# Patient Record
Sex: Female | Born: 1950 | Race: Black or African American | Hispanic: No | Marital: Married | Smoking: Never smoker
Health system: Southern US, Community
[De-identification: ages and names within clinical notes are randomized; demographics above are authoritative.]

## PROBLEM LIST (undated history)

## (undated) HISTORY — PX: BREAST BIOPSY: SHX20

---

## 1997-10-24 ENCOUNTER — Ambulatory Visit (HOSPITAL_COMMUNITY): Admission: RE | Admit: 1997-10-24 | Discharge: 1997-10-24 | Payer: Self-pay | Admitting: Family Medicine

## 1997-10-31 ENCOUNTER — Ambulatory Visit (HOSPITAL_COMMUNITY): Admission: RE | Admit: 1997-10-31 | Discharge: 1997-10-31 | Payer: Self-pay | Admitting: Family Medicine

## 1997-11-19 ENCOUNTER — Ambulatory Visit (HOSPITAL_COMMUNITY): Admission: RE | Admit: 1997-11-19 | Discharge: 1997-11-19 | Payer: Self-pay | Admitting: Surgery

## 1998-11-21 ENCOUNTER — Ambulatory Visit (HOSPITAL_COMMUNITY): Admission: RE | Admit: 1998-11-21 | Discharge: 1998-11-21 | Payer: Self-pay

## 2001-02-09 ENCOUNTER — Other Ambulatory Visit: Admission: RE | Admit: 2001-02-09 | Discharge: 2001-02-09 | Payer: Self-pay | Admitting: *Deleted

## 2001-02-13 ENCOUNTER — Encounter: Payer: Self-pay | Admitting: Family Medicine

## 2001-02-13 ENCOUNTER — Ambulatory Visit (HOSPITAL_COMMUNITY): Admission: RE | Admit: 2001-02-13 | Discharge: 2001-02-13 | Payer: Self-pay | Admitting: Family Medicine

## 2002-06-12 ENCOUNTER — Encounter: Payer: Self-pay | Admitting: Family Medicine

## 2002-06-12 ENCOUNTER — Ambulatory Visit (HOSPITAL_COMMUNITY): Admission: RE | Admit: 2002-06-12 | Discharge: 2002-06-12 | Payer: Self-pay | Admitting: Family Medicine

## 2002-08-27 ENCOUNTER — Ambulatory Visit (HOSPITAL_COMMUNITY): Admission: RE | Admit: 2002-08-27 | Discharge: 2002-08-27 | Payer: Self-pay | Admitting: Gastroenterology

## 2003-06-11 ENCOUNTER — Other Ambulatory Visit: Admission: RE | Admit: 2003-06-11 | Discharge: 2003-06-11 | Payer: Self-pay | Admitting: Family Medicine

## 2003-06-19 ENCOUNTER — Ambulatory Visit (HOSPITAL_COMMUNITY): Admission: RE | Admit: 2003-06-19 | Discharge: 2003-06-19 | Payer: Self-pay | Admitting: Family Medicine

## 2004-06-15 ENCOUNTER — Other Ambulatory Visit: Admission: RE | Admit: 2004-06-15 | Discharge: 2004-06-15 | Payer: Self-pay | Admitting: Family Medicine

## 2004-06-19 ENCOUNTER — Ambulatory Visit (HOSPITAL_COMMUNITY): Admission: RE | Admit: 2004-06-19 | Discharge: 2004-06-19 | Payer: Self-pay | Admitting: Family Medicine

## 2005-06-22 ENCOUNTER — Ambulatory Visit (HOSPITAL_COMMUNITY): Admission: RE | Admit: 2005-06-22 | Discharge: 2005-06-22 | Payer: Self-pay | Admitting: Obstetrics and Gynecology

## 2005-06-30 ENCOUNTER — Encounter: Payer: Self-pay | Admitting: Surgery

## 2005-09-09 ENCOUNTER — Other Ambulatory Visit: Admission: RE | Admit: 2005-09-09 | Discharge: 2005-09-09 | Payer: Self-pay | Admitting: Family Medicine

## 2006-07-12 ENCOUNTER — Ambulatory Visit (HOSPITAL_COMMUNITY): Admission: RE | Admit: 2006-07-12 | Discharge: 2006-07-12 | Payer: Self-pay | Admitting: Family Medicine

## 2006-09-12 ENCOUNTER — Other Ambulatory Visit: Admission: RE | Admit: 2006-09-12 | Discharge: 2006-09-12 | Payer: Self-pay | Admitting: Family Medicine

## 2007-07-20 ENCOUNTER — Ambulatory Visit (HOSPITAL_COMMUNITY): Admission: RE | Admit: 2007-07-20 | Discharge: 2007-07-20 | Payer: Self-pay | Admitting: Family Medicine

## 2007-09-20 ENCOUNTER — Other Ambulatory Visit: Admission: RE | Admit: 2007-09-20 | Discharge: 2007-09-20 | Payer: Self-pay | Admitting: Family Medicine

## 2008-08-07 ENCOUNTER — Ambulatory Visit (HOSPITAL_COMMUNITY): Admission: RE | Admit: 2008-08-07 | Discharge: 2008-08-07 | Payer: Self-pay | Admitting: Family Medicine

## 2008-09-30 ENCOUNTER — Encounter (INDEPENDENT_AMBULATORY_CARE_PROVIDER_SITE_OTHER): Payer: Self-pay | Admitting: Family Medicine

## 2008-09-30 ENCOUNTER — Other Ambulatory Visit: Admission: RE | Admit: 2008-09-30 | Discharge: 2008-09-30 | Payer: Self-pay | Admitting: Family Medicine

## 2009-08-13 ENCOUNTER — Ambulatory Visit (HOSPITAL_COMMUNITY): Admission: RE | Admit: 2009-08-13 | Discharge: 2009-08-13 | Payer: Self-pay | Admitting: Family Medicine

## 2009-10-08 ENCOUNTER — Other Ambulatory Visit: Admission: RE | Admit: 2009-10-08 | Discharge: 2009-10-08 | Payer: Self-pay | Admitting: Family Medicine

## 2010-03-22 ENCOUNTER — Encounter: Payer: Self-pay | Admitting: Family Medicine

## 2010-07-13 ENCOUNTER — Other Ambulatory Visit (HOSPITAL_COMMUNITY): Payer: Self-pay | Admitting: Family Medicine

## 2010-07-13 DIAGNOSIS — Z1231 Encounter for screening mammogram for malignant neoplasm of breast: Secondary | ICD-10-CM

## 2010-07-17 NOTE — Op Note (Signed)
   NAME:  Alicia Rush, Alicia Rush NO.:  [id number]   MEDICAL RECORD NO.:  [id number]                   PATIENT TYPE:   LOCATION:                                       FACILITY:   PHYSICIAN:  Graylin Shiver, M.D.                DATE OF BIRTH:   DATE OF PROCEDURE:  08/27/2002  DATE OF DISCHARGE:                                 OPERATIVE REPORT   INDICATIONS FOR PROCEDURE:  Rectal bleeding and history of colon cancer in  her mother.   DESCRIPTION OF PROCEDURE:  Informed consent was obtained. Premedication was  Fentanyl 50 micrograms IV and Versed 6 mg IV.   With the patient in the left lateral decubitus position a rectal examination  was performed and no masses were felt. The Olympus colonoscope was inserted  into the rectum and advanced  around the colon to the cecum. Cecal landmarks  were identified.   The cecum and ascending colon were normal. The transverse colon was normal.  The descending colon, sigmoid and rectum were normal. The scope was  retroflexed in the  rectum. No abnormalities were seen. She tolerated the  procedure well without complications.   IMPRESSION:  Normal colonoscopy to the cecum.   PLAN:  In view of the family history I would recommend a followup  colonoscopy again in 5 years. I suspect that the intermittent rectal  bleeding that she experiences is secondary to some anal irritation.                                               Graylin Shiver, M.D.    Alicia Rush  D:  08/27/2002  T:  08/27/2002  Job:  [id number]

## 2010-08-17 ENCOUNTER — Ambulatory Visit (HOSPITAL_COMMUNITY)
Admission: RE | Admit: 2010-08-17 | Discharge: 2010-08-17 | Disposition: A | Discharge status: Home / Self Care | Payer: 59 | Source: Ambulatory Visit | Attending: Family Medicine | Admitting: Family Medicine

## 2010-08-17 DIAGNOSIS — Z1231 Encounter for screening mammogram for malignant neoplasm of breast: Secondary | ICD-10-CM | POA: Insufficient documentation

## 2011-07-27 ENCOUNTER — Other Ambulatory Visit (HOSPITAL_COMMUNITY): Payer: Self-pay | Admitting: Family Medicine

## 2011-07-27 DIAGNOSIS — Z1231 Encounter for screening mammogram for malignant neoplasm of breast: Secondary | ICD-10-CM

## 2011-08-23 ENCOUNTER — Ambulatory Visit (HOSPITAL_COMMUNITY)
Admission: RE | Admit: 2011-08-23 | Discharge: 2011-08-23 | Disposition: A | Discharge status: Home / Self Care | Payer: 59 | Source: Ambulatory Visit | Attending: Family Medicine | Admitting: Family Medicine

## 2011-08-23 DIAGNOSIS — Z1231 Encounter for screening mammogram for malignant neoplasm of breast: Secondary | ICD-10-CM | POA: Insufficient documentation

## 2012-08-07 ENCOUNTER — Other Ambulatory Visit (HOSPITAL_COMMUNITY): Payer: Self-pay | Admitting: Family Medicine

## 2012-08-07 DIAGNOSIS — Z1231 Encounter for screening mammogram for malignant neoplasm of breast: Secondary | ICD-10-CM

## 2012-08-25 ENCOUNTER — Ambulatory Visit (HOSPITAL_COMMUNITY)
Admission: RE | Admit: 2012-08-25 | Discharge: 2012-08-25 | Disposition: A | Discharge status: Home / Self Care | Payer: 59 | Source: Ambulatory Visit | Attending: Family Medicine | Admitting: Family Medicine

## 2012-08-25 DIAGNOSIS — Z1231 Encounter for screening mammogram for malignant neoplasm of breast: Secondary | ICD-10-CM | POA: Insufficient documentation

## 2012-08-29 ENCOUNTER — Other Ambulatory Visit: Payer: Self-pay | Admitting: Family Medicine

## 2012-08-29 DIAGNOSIS — R928 Other abnormal and inconclusive findings on diagnostic imaging of breast: Secondary | ICD-10-CM

## 2012-09-08 ENCOUNTER — Ambulatory Visit
Admission: RE | Admit: 2012-09-08 | Discharge: 2012-09-08 | Disposition: A | Discharge status: Home / Self Care | Payer: 59 | Source: Ambulatory Visit | Attending: Family Medicine | Admitting: Family Medicine

## 2012-09-08 ENCOUNTER — Other Ambulatory Visit: Payer: Self-pay | Admitting: Family Medicine

## 2012-09-08 DIAGNOSIS — R928 Other abnormal and inconclusive findings on diagnostic imaging of breast: Secondary | ICD-10-CM

## 2012-09-22 ENCOUNTER — Ambulatory Visit
Admission: RE | Admit: 2012-09-22 | Discharge: 2012-09-22 | Disposition: A | Discharge status: Home / Self Care | Payer: 59 | Source: Ambulatory Visit | Attending: Family Medicine | Admitting: Family Medicine

## 2012-09-22 DIAGNOSIS — R928 Other abnormal and inconclusive findings on diagnostic imaging of breast: Secondary | ICD-10-CM

## 2012-10-23 ENCOUNTER — Other Ambulatory Visit (HOSPITAL_COMMUNITY)
Admission: RE | Admit: 2012-10-23 | Discharge: 2012-10-23 | Disposition: A | Discharge status: Home / Self Care | Payer: 59 | Source: Ambulatory Visit | Attending: Family Medicine | Admitting: Family Medicine

## 2012-10-23 ENCOUNTER — Other Ambulatory Visit: Payer: Self-pay | Admitting: Family Medicine

## 2012-10-23 DIAGNOSIS — Z0000 Encounter for general adult medical examination without abnormal findings: Secondary | ICD-10-CM | POA: Insufficient documentation

## 2013-09-17 ENCOUNTER — Other Ambulatory Visit: Payer: Self-pay

## 2013-09-17 DIAGNOSIS — Z1231 Encounter for screening mammogram for malignant neoplasm of breast: Secondary | ICD-10-CM

## 2013-09-28 ENCOUNTER — Ambulatory Visit
Admission: RE | Admit: 2013-09-28 | Discharge: 2013-09-28 | Disposition: A | Discharge status: Home / Self Care | Payer: 59 | Source: Ambulatory Visit

## 2013-09-28 DIAGNOSIS — Z1231 Encounter for screening mammogram for malignant neoplasm of breast: Secondary | ICD-10-CM

## 2014-08-28 ENCOUNTER — Other Ambulatory Visit: Payer: Self-pay

## 2014-08-28 DIAGNOSIS — Z1231 Encounter for screening mammogram for malignant neoplasm of breast: Secondary | ICD-10-CM

## 2014-10-09 ENCOUNTER — Ambulatory Visit
Admission: RE | Admit: 2014-10-09 | Discharge: 2014-10-09 | Disposition: A | Discharge status: Home / Self Care | Payer: 59 | Source: Ambulatory Visit

## 2014-10-09 DIAGNOSIS — Z1231 Encounter for screening mammogram for malignant neoplasm of breast: Secondary | ICD-10-CM

## 2015-09-30 ENCOUNTER — Other Ambulatory Visit: Payer: Self-pay | Admitting: Family Medicine

## 2015-09-30 DIAGNOSIS — Z1231 Encounter for screening mammogram for malignant neoplasm of breast: Secondary | ICD-10-CM

## 2015-10-13 ENCOUNTER — Ambulatory Visit: Payer: 59

## 2015-10-14 ENCOUNTER — Ambulatory Visit
Admission: RE | Admit: 2015-10-14 | Discharge: 2015-10-14 | Disposition: A | Discharge status: Home / Self Care | Payer: 59 | Source: Ambulatory Visit | Attending: Family Medicine | Admitting: Family Medicine

## 2015-10-14 DIAGNOSIS — Z1231 Encounter for screening mammogram for malignant neoplasm of breast: Secondary | ICD-10-CM

## 2015-11-04 ENCOUNTER — Other Ambulatory Visit (HOSPITAL_COMMUNITY)
Admission: RE | Admit: 2015-11-04 | Discharge: 2015-11-04 | Disposition: A | Discharge status: Home / Self Care | Payer: 59 | Source: Ambulatory Visit | Attending: Family Medicine | Admitting: Family Medicine

## 2015-11-04 ENCOUNTER — Other Ambulatory Visit: Payer: Self-pay | Admitting: Family Medicine

## 2015-11-04 DIAGNOSIS — Z01419 Encounter for gynecological examination (general) (routine) without abnormal findings: Secondary | ICD-10-CM | POA: Diagnosis not present

## 2015-11-05 LAB — CYTOLOGY - PAP

## 2016-09-10 ENCOUNTER — Other Ambulatory Visit: Payer: Self-pay | Admitting: Family Medicine

## 2016-09-10 DIAGNOSIS — Z1231 Encounter for screening mammogram for malignant neoplasm of breast: Secondary | ICD-10-CM

## 2016-10-19 ENCOUNTER — Ambulatory Visit
Admission: RE | Admit: 2016-10-19 | Discharge: 2016-10-19 | Disposition: A | Discharge status: Home / Self Care | Payer: 59 | Source: Ambulatory Visit | Attending: Family Medicine | Admitting: Family Medicine

## 2016-10-19 DIAGNOSIS — Z1231 Encounter for screening mammogram for malignant neoplasm of breast: Secondary | ICD-10-CM

## 2017-09-06 DIAGNOSIS — J0100 Acute maxillary sinusitis, unspecified: Secondary | ICD-10-CM | POA: Diagnosis not present

## 2017-09-29 ENCOUNTER — Other Ambulatory Visit: Payer: Self-pay | Admitting: Family Medicine

## 2017-09-29 DIAGNOSIS — Z1231 Encounter for screening mammogram for malignant neoplasm of breast: Secondary | ICD-10-CM

## 2017-11-04 ENCOUNTER — Ambulatory Visit: Payer: 59

## 2017-11-04 DIAGNOSIS — R030 Elevated blood-pressure reading, without diagnosis of hypertension: Secondary | ICD-10-CM | POA: Diagnosis not present

## 2017-11-04 DIAGNOSIS — E039 Hypothyroidism, unspecified: Secondary | ICD-10-CM | POA: Diagnosis not present

## 2017-11-04 DIAGNOSIS — L680 Hirsutism: Secondary | ICD-10-CM | POA: Diagnosis not present

## 2017-11-04 DIAGNOSIS — Z1159 Encounter for screening for other viral diseases: Secondary | ICD-10-CM | POA: Diagnosis not present

## 2017-11-04 DIAGNOSIS — Z23 Encounter for immunization: Secondary | ICD-10-CM | POA: Diagnosis not present

## 2017-11-04 DIAGNOSIS — E2839 Other primary ovarian failure: Secondary | ICD-10-CM | POA: Diagnosis not present

## 2017-11-04 DIAGNOSIS — Z0000 Encounter for general adult medical examination without abnormal findings: Secondary | ICD-10-CM | POA: Diagnosis not present

## 2017-11-04 DIAGNOSIS — E782 Mixed hyperlipidemia: Secondary | ICD-10-CM | POA: Diagnosis not present

## 2017-11-08 ENCOUNTER — Ambulatory Visit
Admission: RE | Admit: 2017-11-08 | Discharge: 2017-11-08 | Disposition: A | Discharge status: Home / Self Care | Payer: Medicare Other | Source: Ambulatory Visit | Attending: Family Medicine | Admitting: Family Medicine

## 2017-11-08 DIAGNOSIS — Z1231 Encounter for screening mammogram for malignant neoplasm of breast: Secondary | ICD-10-CM

## 2017-11-21 DIAGNOSIS — Z1211 Encounter for screening for malignant neoplasm of colon: Secondary | ICD-10-CM | POA: Diagnosis not present

## 2017-11-21 DIAGNOSIS — K5140 Inflammatory polyps of colon without complications: Secondary | ICD-10-CM | POA: Diagnosis not present

## 2017-11-21 DIAGNOSIS — K635 Polyp of colon: Secondary | ICD-10-CM | POA: Diagnosis not present

## 2017-11-21 DIAGNOSIS — Z800 Family history of malignant neoplasm of digestive organs: Secondary | ICD-10-CM | POA: Diagnosis not present

## 2017-11-23 DIAGNOSIS — K5140 Inflammatory polyps of colon without complications: Secondary | ICD-10-CM | POA: Diagnosis not present

## 2017-11-23 DIAGNOSIS — K635 Polyp of colon: Secondary | ICD-10-CM | POA: Diagnosis not present

## 2017-12-20 DIAGNOSIS — E2839 Other primary ovarian failure: Secondary | ICD-10-CM | POA: Diagnosis not present

## 2018-07-17 DIAGNOSIS — M79605 Pain in left leg: Secondary | ICD-10-CM | POA: Diagnosis not present

## 2018-07-17 DIAGNOSIS — M25562 Pain in left knee: Secondary | ICD-10-CM | POA: Diagnosis not present

## 2018-07-17 DIAGNOSIS — M7122 Synovial cyst of popliteal space [Baker], left knee: Secondary | ICD-10-CM | POA: Diagnosis not present

## 2018-07-17 DIAGNOSIS — E7800 Pure hypercholesterolemia, unspecified: Secondary | ICD-10-CM | POA: Diagnosis not present

## 2018-07-17 DIAGNOSIS — L680 Hirsutism: Secondary | ICD-10-CM | POA: Diagnosis not present

## 2018-09-06 DIAGNOSIS — M25562 Pain in left knee: Secondary | ICD-10-CM | POA: Diagnosis not present

## 2018-09-18 DIAGNOSIS — E782 Mixed hyperlipidemia: Secondary | ICD-10-CM | POA: Diagnosis not present

## 2018-10-03 ENCOUNTER — Other Ambulatory Visit: Payer: Self-pay | Admitting: Family Medicine

## 2018-10-03 DIAGNOSIS — Z1231 Encounter for screening mammogram for malignant neoplasm of breast: Secondary | ICD-10-CM

## 2018-11-10 DIAGNOSIS — E7800 Pure hypercholesterolemia, unspecified: Secondary | ICD-10-CM | POA: Diagnosis not present

## 2018-11-10 DIAGNOSIS — Z0000 Encounter for general adult medical examination without abnormal findings: Secondary | ICD-10-CM | POA: Diagnosis not present

## 2018-11-10 DIAGNOSIS — E039 Hypothyroidism, unspecified: Secondary | ICD-10-CM | POA: Diagnosis not present

## 2018-11-10 DIAGNOSIS — R030 Elevated blood-pressure reading, without diagnosis of hypertension: Secondary | ICD-10-CM | POA: Diagnosis not present

## 2018-11-10 DIAGNOSIS — R600 Localized edema: Secondary | ICD-10-CM | POA: Diagnosis not present

## 2018-11-15 DIAGNOSIS — Z23 Encounter for immunization: Secondary | ICD-10-CM | POA: Diagnosis not present

## 2018-11-15 DIAGNOSIS — R600 Localized edema: Secondary | ICD-10-CM | POA: Diagnosis not present

## 2018-11-15 DIAGNOSIS — E039 Hypothyroidism, unspecified: Secondary | ICD-10-CM | POA: Diagnosis not present

## 2018-11-16 ENCOUNTER — Ambulatory Visit: Payer: Medicare Other

## 2018-11-17 ENCOUNTER — Ambulatory Visit
Admission: RE | Admit: 2018-11-17 | Discharge: 2018-11-17 | Disposition: A | Discharge status: Home / Self Care | Payer: Medicare Other | Source: Ambulatory Visit | Attending: Family Medicine | Admitting: Family Medicine

## 2018-11-17 ENCOUNTER — Other Ambulatory Visit: Payer: Self-pay

## 2018-11-17 DIAGNOSIS — Z1231 Encounter for screening mammogram for malignant neoplasm of breast: Secondary | ICD-10-CM | POA: Diagnosis not present

## 2019-03-08 DIAGNOSIS — M25562 Pain in left knee: Secondary | ICD-10-CM | POA: Diagnosis not present

## 2019-03-23 DIAGNOSIS — N760 Acute vaginitis: Secondary | ICD-10-CM | POA: Diagnosis not present

## 2019-05-22 DIAGNOSIS — R030 Elevated blood-pressure reading, without diagnosis of hypertension: Secondary | ICD-10-CM | POA: Diagnosis not present

## 2019-05-22 DIAGNOSIS — E039 Hypothyroidism, unspecified: Secondary | ICD-10-CM | POA: Diagnosis not present

## 2019-05-22 DIAGNOSIS — R600 Localized edema: Secondary | ICD-10-CM | POA: Diagnosis not present

## 2019-08-14 DIAGNOSIS — M65332 Trigger finger, left middle finger: Secondary | ICD-10-CM | POA: Diagnosis not present

## 2019-09-25 DIAGNOSIS — M65332 Trigger finger, left middle finger: Secondary | ICD-10-CM | POA: Diagnosis not present

## 2019-10-16 ENCOUNTER — Other Ambulatory Visit: Payer: Self-pay | Admitting: Family Medicine

## 2019-10-16 DIAGNOSIS — Z0000 Encounter for general adult medical examination without abnormal findings: Secondary | ICD-10-CM

## 2019-10-29 DIAGNOSIS — E7800 Pure hypercholesterolemia, unspecified: Secondary | ICD-10-CM | POA: Diagnosis not present

## 2019-10-29 DIAGNOSIS — E039 Hypothyroidism, unspecified: Secondary | ICD-10-CM | POA: Diagnosis not present

## 2019-10-29 DIAGNOSIS — I10 Essential (primary) hypertension: Secondary | ICD-10-CM | POA: Diagnosis not present

## 2019-11-12 DIAGNOSIS — I10 Essential (primary) hypertension: Secondary | ICD-10-CM | POA: Diagnosis not present

## 2019-11-12 DIAGNOSIS — E7800 Pure hypercholesterolemia, unspecified: Secondary | ICD-10-CM | POA: Diagnosis not present

## 2019-11-12 DIAGNOSIS — Z0000 Encounter for general adult medical examination without abnormal findings: Secondary | ICD-10-CM | POA: Diagnosis not present

## 2019-11-12 DIAGNOSIS — Z23 Encounter for immunization: Secondary | ICD-10-CM | POA: Diagnosis not present

## 2019-11-12 DIAGNOSIS — E039 Hypothyroidism, unspecified: Secondary | ICD-10-CM | POA: Diagnosis not present

## 2019-11-20 ENCOUNTER — Other Ambulatory Visit: Payer: Self-pay

## 2019-11-20 ENCOUNTER — Ambulatory Visit
Admission: RE | Admit: 2019-11-20 | Discharge: 2019-11-20 | Disposition: A | Discharge status: Home / Self Care | Payer: Medicare Other | Source: Ambulatory Visit | Attending: Family Medicine | Admitting: Family Medicine

## 2019-11-20 DIAGNOSIS — Z0000 Encounter for general adult medical examination without abnormal findings: Secondary | ICD-10-CM

## 2019-11-20 DIAGNOSIS — Z1231 Encounter for screening mammogram for malignant neoplasm of breast: Secondary | ICD-10-CM | POA: Diagnosis not present

## 2019-12-14 DIAGNOSIS — Z23 Encounter for immunization: Secondary | ICD-10-CM | POA: Diagnosis not present

## 2020-05-16 DIAGNOSIS — M7731 Calcaneal spur, right foot: Secondary | ICD-10-CM | POA: Diagnosis not present

## 2020-05-16 DIAGNOSIS — M79671 Pain in right foot: Secondary | ICD-10-CM | POA: Diagnosis not present

## 2020-06-02 DIAGNOSIS — M79671 Pain in right foot: Secondary | ICD-10-CM | POA: Diagnosis not present

## 2020-06-03 DIAGNOSIS — E7800 Pure hypercholesterolemia, unspecified: Secondary | ICD-10-CM | POA: Diagnosis not present

## 2020-06-03 DIAGNOSIS — I10 Essential (primary) hypertension: Secondary | ICD-10-CM | POA: Diagnosis not present

## 2020-06-03 DIAGNOSIS — E039 Hypothyroidism, unspecified: Secondary | ICD-10-CM | POA: Diagnosis not present

## 2020-06-06 DIAGNOSIS — M79671 Pain in right foot: Secondary | ICD-10-CM | POA: Diagnosis not present

## 2020-06-10 DIAGNOSIS — M79671 Pain in right foot: Secondary | ICD-10-CM | POA: Diagnosis not present

## 2020-06-13 DIAGNOSIS — M79671 Pain in right foot: Secondary | ICD-10-CM | POA: Diagnosis not present

## 2020-06-17 DIAGNOSIS — M79671 Pain in right foot: Secondary | ICD-10-CM | POA: Diagnosis not present

## 2020-06-20 DIAGNOSIS — M79671 Pain in right foot: Secondary | ICD-10-CM | POA: Diagnosis not present

## 2020-06-23 DIAGNOSIS — M79671 Pain in right foot: Secondary | ICD-10-CM | POA: Diagnosis not present

## 2020-06-25 DIAGNOSIS — M79671 Pain in right foot: Secondary | ICD-10-CM | POA: Diagnosis not present

## 2020-07-01 DIAGNOSIS — M79671 Pain in right foot: Secondary | ICD-10-CM | POA: Diagnosis not present

## 2020-07-03 DIAGNOSIS — M79671 Pain in right foot: Secondary | ICD-10-CM | POA: Diagnosis not present

## 2020-07-15 DIAGNOSIS — M79671 Pain in right foot: Secondary | ICD-10-CM | POA: Diagnosis not present

## 2020-08-12 DIAGNOSIS — E7800 Pure hypercholesterolemia, unspecified: Secondary | ICD-10-CM | POA: Diagnosis not present

## 2020-08-12 DIAGNOSIS — I10 Essential (primary) hypertension: Secondary | ICD-10-CM | POA: Diagnosis not present

## 2020-08-12 DIAGNOSIS — E039 Hypothyroidism, unspecified: Secondary | ICD-10-CM | POA: Diagnosis not present

## 2020-08-12 DIAGNOSIS — H3581 Retinal edema: Secondary | ICD-10-CM | POA: Diagnosis not present

## 2020-08-30 DIAGNOSIS — R059 Cough, unspecified: Secondary | ICD-10-CM | POA: Diagnosis not present

## 2020-08-30 DIAGNOSIS — R0981 Nasal congestion: Secondary | ICD-10-CM | POA: Diagnosis not present

## 2020-08-30 DIAGNOSIS — R509 Fever, unspecified: Secondary | ICD-10-CM | POA: Diagnosis not present

## 2020-08-30 DIAGNOSIS — U071 COVID-19: Secondary | ICD-10-CM | POA: Diagnosis not present

## 2020-08-30 DIAGNOSIS — R5383 Other fatigue: Secondary | ICD-10-CM | POA: Diagnosis not present

## 2020-10-19 IMAGING — MG MM DIGITAL SCREENING BILAT W/ TOMO W/ CAD
6 of 12 series · 6 of 36 positions shown · non-contrast
Comparison: Previous exam(s).

CLINICAL DATA: Screening.

EXAM:
DIGITAL SCREENING BILATERAL MAMMOGRAM WITH TOMO AND CAD

[R MLO synth-2D (1 of 2)]
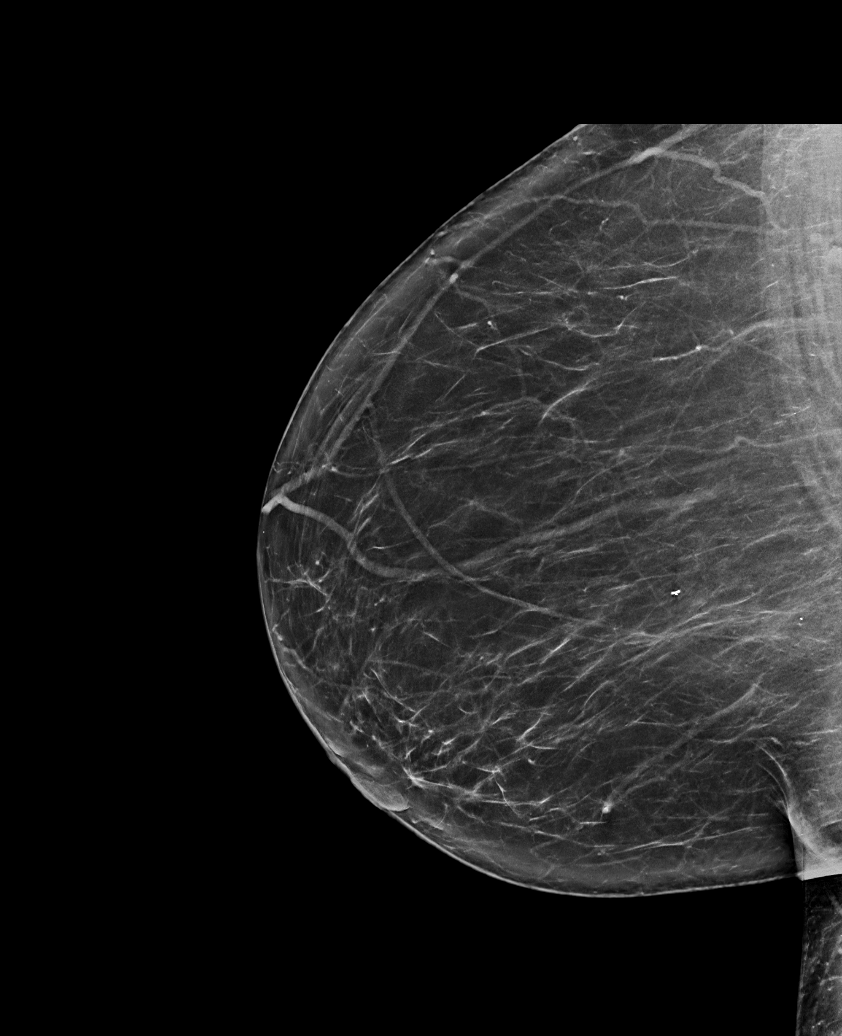

[R MLO synth-2D (2 of 2)]
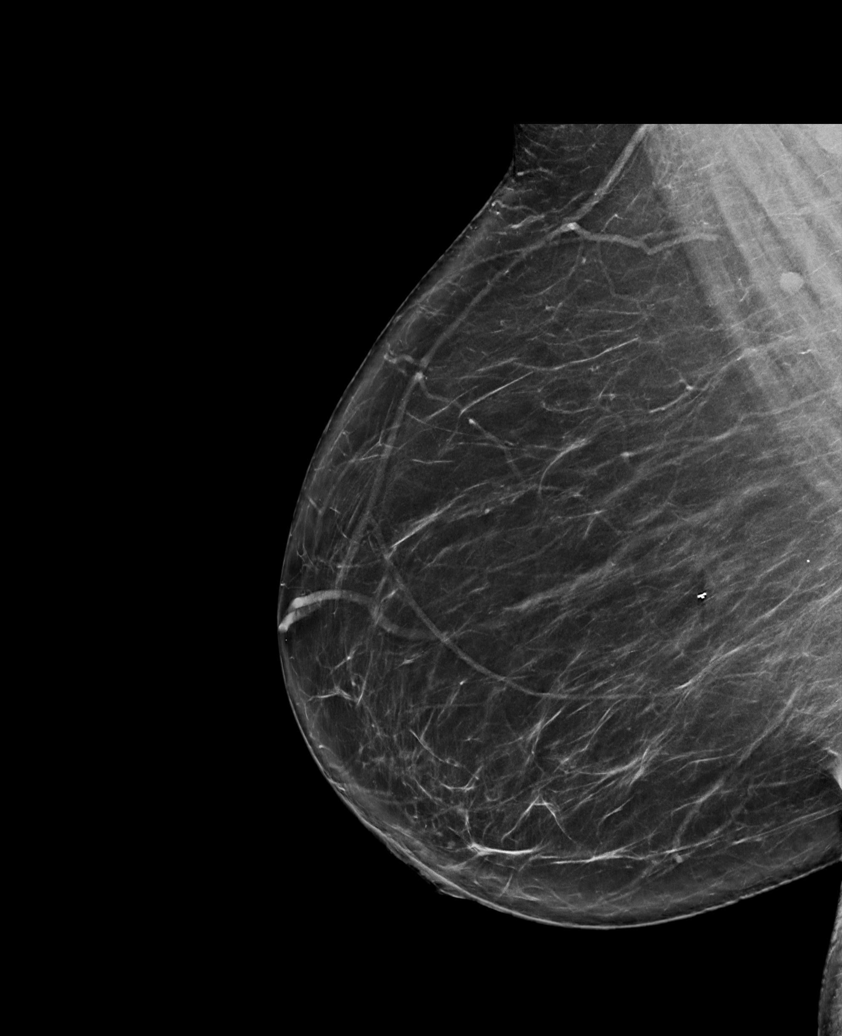

[L CC synth-2D (1 of 2)]
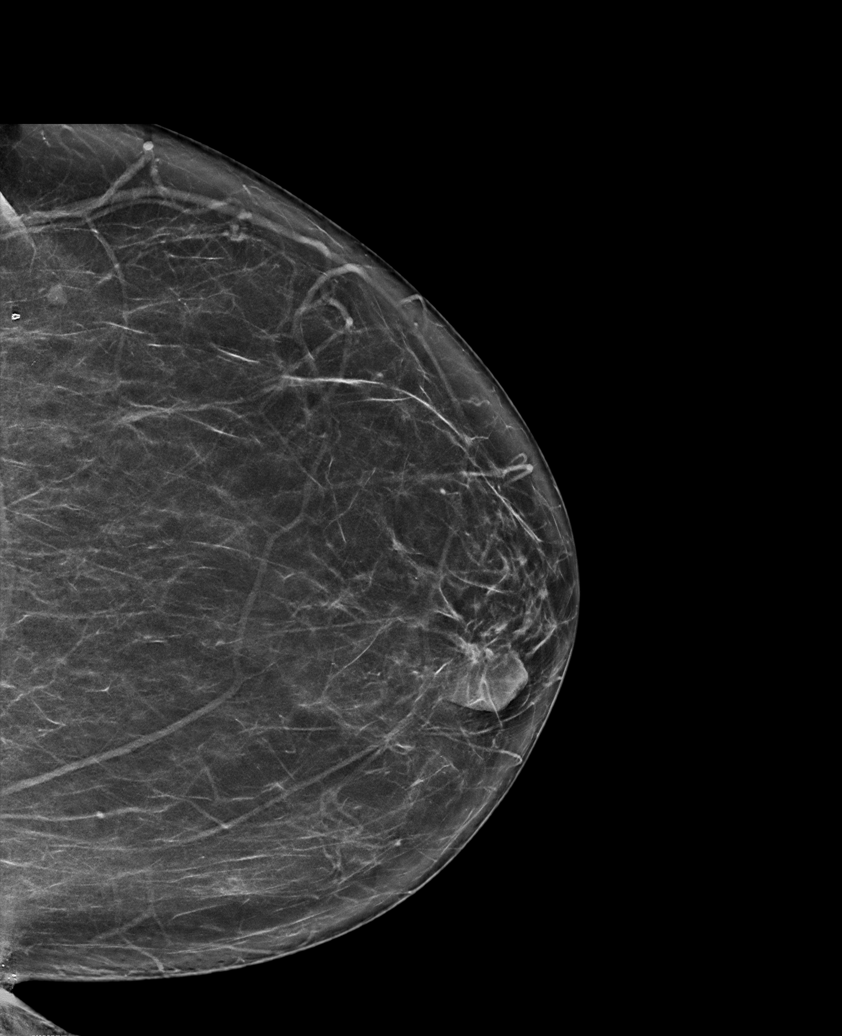

[L MLO synth-2D]
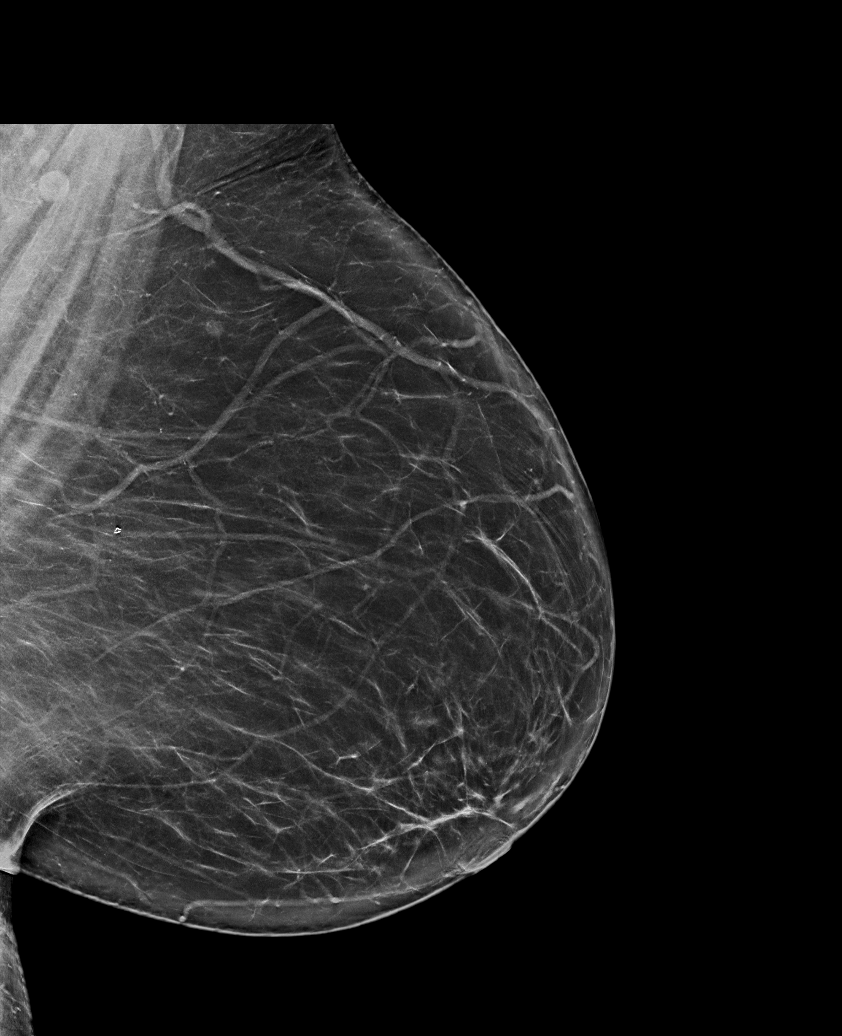

[R CC synth-2D]
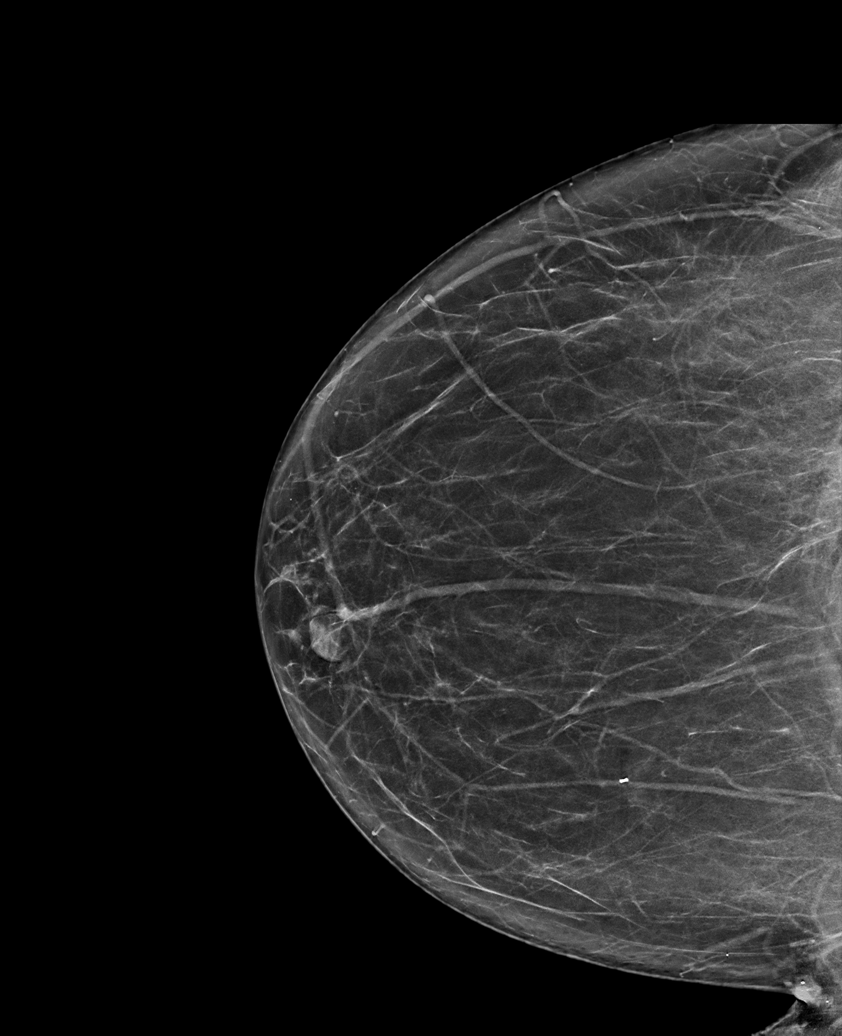

[L CC synth-2D (2 of 2)]
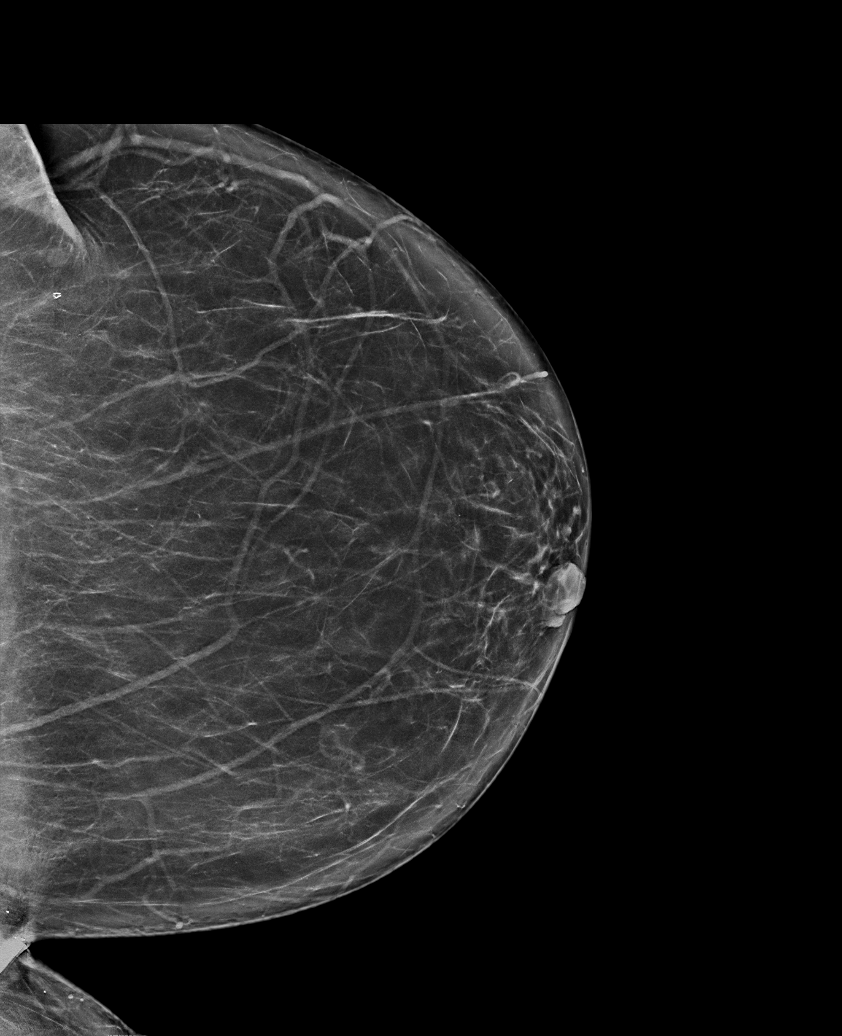

[6 of 36 positions shown; findings below may reference images not displayed]

ACR Breast Density Category b: There are scattered areas of
fibroglandular density.
FINDINGS: There are no findings suspicious for malignancy. Images were
processed with CAD.
IMPRESSION: No mammographic evidence of malignancy. A result letter of this
screening mammogram will be mailed directly to the patient.

RECOMMENDATION:
Screening mammogram in one year. (Code:CN-[id number])

BI-RADS CATEGORY  1: Negative.

## 2020-10-20 DIAGNOSIS — I10 Essential (primary) hypertension: Secondary | ICD-10-CM | POA: Diagnosis not present

## 2020-10-20 DIAGNOSIS — E7800 Pure hypercholesterolemia, unspecified: Secondary | ICD-10-CM | POA: Diagnosis not present

## 2020-10-20 DIAGNOSIS — E039 Hypothyroidism, unspecified: Secondary | ICD-10-CM | POA: Diagnosis not present

## 2020-11-07 ENCOUNTER — Other Ambulatory Visit: Payer: Self-pay | Admitting: Family Medicine

## 2020-11-07 DIAGNOSIS — Z1231 Encounter for screening mammogram for malignant neoplasm of breast: Secondary | ICD-10-CM

## 2020-11-25 ENCOUNTER — Other Ambulatory Visit: Payer: Self-pay

## 2020-11-25 ENCOUNTER — Ambulatory Visit
Admission: RE | Admit: 2020-11-25 | Discharge: 2020-11-25 | Disposition: A | Discharge status: Home / Self Care | Payer: Medicare Other | Source: Ambulatory Visit | Attending: Family Medicine | Admitting: Family Medicine

## 2020-11-25 DIAGNOSIS — Z1231 Encounter for screening mammogram for malignant neoplasm of breast: Secondary | ICD-10-CM | POA: Diagnosis not present

## 2021-01-05 DIAGNOSIS — M170 Bilateral primary osteoarthritis of knee: Secondary | ICD-10-CM | POA: Diagnosis not present

## 2021-01-05 DIAGNOSIS — M79671 Pain in right foot: Secondary | ICD-10-CM | POA: Diagnosis not present

## 2021-01-08 DIAGNOSIS — E7800 Pure hypercholesterolemia, unspecified: Secondary | ICD-10-CM | POA: Diagnosis not present

## 2021-01-08 DIAGNOSIS — E039 Hypothyroidism, unspecified: Secondary | ICD-10-CM | POA: Diagnosis not present

## 2021-01-08 DIAGNOSIS — I10 Essential (primary) hypertension: Secondary | ICD-10-CM | POA: Diagnosis not present

## 2021-01-09 ENCOUNTER — Emergency Department (HOSPITAL_COMMUNITY): Payer: Medicare Other

## 2021-01-09 ENCOUNTER — Other Ambulatory Visit: Payer: Self-pay

## 2021-01-09 ENCOUNTER — Emergency Department (HOSPITAL_COMMUNITY)
Admission: EM | Admit: 2021-01-09 | Discharge: 2021-01-10 | Disposition: A | Discharge status: Home / Self Care | Payer: Medicare Other | Attending: Emergency Medicine | Admitting: Emergency Medicine

## 2021-01-09 DIAGNOSIS — I639 Cerebral infarction, unspecified: Secondary | ICD-10-CM | POA: Diagnosis not present

## 2021-01-09 DIAGNOSIS — R9431 Abnormal electrocardiogram [ECG] [EKG]: Secondary | ICD-10-CM | POA: Diagnosis not present

## 2021-01-09 DIAGNOSIS — M6281 Muscle weakness (generalized): Secondary | ICD-10-CM | POA: Diagnosis not present

## 2021-01-09 DIAGNOSIS — R29898 Other symptoms and signs involving the musculoskeletal system: Secondary | ICD-10-CM | POA: Diagnosis not present

## 2021-01-09 DIAGNOSIS — R29818 Other symptoms and signs involving the nervous system: Secondary | ICD-10-CM | POA: Diagnosis not present

## 2021-01-09 DIAGNOSIS — G43109 Migraine with aura, not intractable, without status migrainosus: Secondary | ICD-10-CM | POA: Diagnosis not present

## 2021-01-09 DIAGNOSIS — R531 Weakness: Secondary | ICD-10-CM | POA: Diagnosis not present

## 2021-01-09 DIAGNOSIS — G43809 Other migraine, not intractable, without status migrainosus: Secondary | ICD-10-CM | POA: Diagnosis not present

## 2021-01-09 LAB — DIFFERENTIAL
Abs Immature Granulocytes: 0.02 10*3/uL (ref 0.00–0.07)
Basophils Absolute: 0.1 10*3/uL (ref 0.0–0.1)
Basophils Relative: 1 %
Eosinophils Absolute: 0.3 10*3/uL (ref 0.0–0.5)
Eosinophils Relative: 4 %
Immature Granulocytes: 0 %
Lymphocytes Relative: 33 %
Lymphs Abs: 2.5 10*3/uL (ref 0.7–4.0)
Monocytes Absolute: 0.6 10*3/uL (ref 0.1–1.0)
Monocytes Relative: 8 %
Neutro Abs: 4.2 10*3/uL (ref 1.7–7.7)
Neutrophils Relative %: 54 %

## 2021-01-09 LAB — COMPREHENSIVE METABOLIC PANEL
ALT: 14 U/L (ref 0–44)
AST: 20 U/L (ref 15–41)
Albumin: 3.7 g/dL (ref 3.5–5.0)
Alkaline Phosphatase: 71 U/L (ref 38–126)
Anion gap: 8 (ref 5–15)
BUN: 19 mg/dL (ref 8–23)
CO2: 27 mmol/L (ref 22–32)
Calcium: 9 mg/dL (ref 8.9–10.3)
Chloride: 104 mmol/L (ref 98–111)
Creatinine, Ser: 0.97 mg/dL (ref 0.44–1.00)
GFR, Estimated: >60 mL/min (ref >60)
Glucose, Bld: 105 mg/dL — ABNORMAL HIGH (ref 70–99)
Potassium: 3.2 mmol/L — ABNORMAL LOW (ref 3.5–5.1)
Sodium: 139 mmol/L (ref 135–145)
Total Bilirubin: 0.8 mg/dL (ref 0.3–1.2)
Total Protein: 7.2 g/dL (ref 6.5–8.1)

## 2021-01-09 LAB — I-STAT CHEM 8, ED
BUN: 21 mg/dL (ref 8–23)
Calcium, Ion: 1.17 mmol/L (ref 1.15–1.40)
Chloride: 102 mmol/L (ref 98–111)
Creatinine, Ser: 1 mg/dL (ref 0.44–1.00)
Glucose, Bld: 105 mg/dL — ABNORMAL HIGH (ref 70–99)
HCT: 44 % (ref 36.0–46.0)
Hemoglobin: 15 g/dL (ref 12.0–15.0)
Potassium: 3.2 mmol/L — ABNORMAL LOW (ref 3.5–5.1)
Sodium: 142 mmol/L (ref 135–145)
TCO2: 27 mmol/L (ref 22–32)

## 2021-01-09 LAB — CBC
HCT: 42.8 % (ref 36.0–46.0)
Hemoglobin: 14.7 g/dL (ref 12.0–15.0)
MCH: 33 pg (ref 26.0–34.0)
MCHC: 34.3 g/dL (ref 30.0–36.0)
MCV: 96 fL (ref 80.0–100.0)
Platelets: 187 10*3/uL (ref 150–400)
RBC: 4.46 MIL/uL (ref 3.87–5.11)
RDW: 12.6 % (ref 11.5–15.5)
WBC: 7.7 10*3/uL (ref 4.0–10.5)
nRBC: 0 % (ref 0.0–0.2)

## 2021-01-09 LAB — CBG MONITORING, ED: Glucose-Capillary: 106 mg/dL — ABNORMAL HIGH (ref 70–99)

## 2021-01-09 LAB — PROTIME-INR
INR: 1.1 (ref 0.8–1.2)
Prothrombin Time: 14.1 seconds (ref 11.4–15.2)

## 2021-01-09 LAB — APTT: aPTT: 30 seconds (ref 24–36)

## 2021-01-09 MED ORDER — PROCHLORPERAZINE EDISYLATE 10 MG/2ML IJ SOLN
10.0000 mg | Freq: Once | INTRAMUSCULAR | Status: AC
  Administered 2021-01-09: 10 mg via INTRAVENOUS
  Filled 2021-01-09: qty 2

## 2021-01-09 MED ORDER — KETOROLAC TROMETHAMINE 30 MG/ML IJ SOLN
30.0000 mg | Freq: Once | INTRAMUSCULAR | Status: AC
  Administered 2021-01-09: 30 mg via INTRAVENOUS
  Filled 2021-01-09: qty 1

## 2021-01-09 MED ORDER — SODIUM CHLORIDE 0.9% FLUSH
3.0000 mL | Freq: Once | INTRAVENOUS | Status: AC
Start: 2021-01-09 — End: 2021-01-09
  Administered 2021-01-09: 3 mL via INTRAVENOUS

## 2021-01-09 MED ORDER — SODIUM CHLORIDE 0.9 % IV BOLUS
1000.0000 mL | Freq: Once | INTRAVENOUS | Status: AC
  Administered 2021-01-09: 1000 mL via INTRAVENOUS

## 2021-01-09 MED ORDER — ONDANSETRON HCL 4 MG/2ML IJ SOLN: 4.0000 mg | Freq: Once | INTRAMUSCULAR | Status: AC

## 2021-01-09 MED ORDER — ONDANSETRON HCL 4 MG/2ML IJ SOLN
INTRAMUSCULAR | Status: AC
  Administered 2021-01-09: 4 mg via INTRAVENOUS
  Filled 2021-01-09: qty 2

## 2021-01-09 NOTE — ED Triage Notes (Addendum)
Pt c/o right facial droop and right arm weakness that started approx. 1.5 hours prior to arrival, also c/o headache and chest pain. Code stroke activated by Dr. Karene Fry.

## 2021-01-09 NOTE — ED Provider Notes (Signed)
[facility] EMERGENCY DEPARTMENT Provider Note   CSN: [id number] Arrival date & time: 01/09/21  2220     History Chief Complaint  Patient presents with   Weakness    Alicia Rush is a 70 y.o. female.   Weakness  This patient is a 70 year old female, she has medical history that she reports is insignificant, she denies taking any anticoagulants.  She presents to the hospital today with a complaint of right sided facial droop and right arm weakness.  She actually states that while she was at church she felt like both of her arms were heavy, this was in the shoulder region.  While she was on the way to the hospital within the last hour she noticed that her right arm was weak and possibly had some right-sided facial droop.  She has not had this in the past.  She denies to me that she is having any chest pain but did complain of some headache.  The symptoms are persistent, nothing seems to make it better or worse  No past medical history on file.  There are no problems to display for this patient.   Past Surgical History:  Procedure Laterality Date   BREAST BIOPSY Bilateral      OB History   No obstetric history on file.     Family History  Problem Relation Age of Onset   Breast cancer Maternal Aunt        Home Medications Prior to Admission medications   Not on File    Allergies    Patient has no known allergies.  Review of Systems   Review of Systems  Neurological:  Positive for weakness.  All other systems reviewed and are negative.  Physical Exam Updated Vital Signs BP (!) 186/65   Pulse 90   Temp (!) 97.4 F (36.3 C) (Oral)   Resp 18   Wt 103.3 kg   SpO2 97%   Physical Exam Vitals and nursing note reviewed.  Constitutional:      General: She is not in acute distress.    Appearance: She is well-developed.  HENT:     Head: Normocephalic and atraumatic.     Mouth/Throat:     Pharynx: No oropharyngeal exudate.  Eyes:      General: No scleral icterus.       Right eye: No discharge.        Left eye: No discharge.     Conjunctiva/sclera: Conjunctivae normal.     Pupils: Pupils are equal, round, and reactive to light.  Neck:     Thyroid: No thyromegaly.     Vascular: No JVD.  Cardiovascular:     Rate and Rhythm: Normal rate and regular rhythm.     Heart sounds: Normal heart sounds. No murmur heard.   No friction rub. No gallop.  Pulmonary:     Effort: Pulmonary effort is normal. No respiratory distress.     Breath sounds: Normal breath sounds. No wheezing or rales.  Abdominal:     General: Bowel sounds are normal. There is no distension.     Palpations: Abdomen is soft. There is no mass.     Tenderness: There is no abdominal tenderness.  Musculoskeletal:        General: No tenderness. Normal range of motion.     Cervical back: Normal range of motion and neck supple.  Lymphadenopathy:     Cervical: No cervical adenopathy.  Skin:    General: Skin is warm and dry.  Findings: No erythema or rash.  Neurological:     Mental Status: She is alert.     Coordination: Coordination normal.     Comments: The patient has some weakness of the right arm but it does appear to be somewhat driven by effort, there also appears to be some abnormal facial asymmetry which is not constant either.  In fact she has a normal smile and normal frown and does not have any asymmetry but when she talks her mouth pulls off to the side a little bit.  There is no weakness in the legs, she has no sensory deficits right or left.  Cranial nerves III through XII otherwise appear normal and her peripheral visual fields are normal.  Speech is clear  Psychiatric:        Behavior: Behavior normal.    ED Results / Procedures / Treatments   Labs (all labs ordered are listed, but only abnormal results are displayed) Labs Reviewed  COMPREHENSIVE METABOLIC PANEL - Abnormal; Notable for the following components:      Result Value   Potassium  3.2 (*)    Glucose, Bld 105 (*)    All other components within normal limits  CBG MONITORING, ED - Abnormal; Notable for the following components:   Glucose-Capillary 106 (*)    All other components within normal limits  I-STAT CHEM 8, ED - Abnormal; Notable for the following components:   Potassium 3.2 (*)    Glucose, Bld 105 (*)    All other components within normal limits  PROTIME-INR  APTT  CBC  DIFFERENTIAL  CBG MONITORING, ED    EKG None  Radiology CT HEAD CODE STROKE WO CONTRAST  Result Date: 01/09/2021 CLINICAL DATA:  Code stroke. Initial evaluation for acute headache, right-sided weakness. EXAM: CT HEAD WITHOUT CONTRAST TECHNIQUE: Contiguous axial images were obtained from the base of the skull through the vertex without intravenous contrast. COMPARISON:  None available. FINDINGS: Brain: Cerebral volume within normal limits for patient age. No evidence for acute intracranial hemorrhage. No findings to suggest acute large vessel territory infarct. No mass lesion, midline shift, or mass effect. Ventricles are normal in size without evidence for hydrocephalus. No extra-axial fluid collection identified. Vascular: No hyperdense vessel identified. Skull: Scalp soft tissues demonstrate no acute abnormality. Calvarium intact. Sinuses/Orbits: Globes and orbital soft tissues within normal limits. Mild mucosal thickening within the ethmoidal air cells. Paranasal sinuses are otherwise clear. No mastoid effusion. ASPECTS [facility] Stroke Program Early CT Score) - Ganglionic level infarction (caudate, lentiform nuclei, internal capsule, insula, M1-M3 cortex): 7 - Supraganglionic infarction (M4-M6 cortex): 3 Total score (0-10 with 10 being normal): 10 IMPRESSION: 1. Negative head CT.  No acute intracranial abnormality. 2. ASPECTS is 10. Results discussed by telephone at the time of interpretation on 01/09/2021 at 10:48 p.m. to provider Dr. Derry Lory, who verbally acknowledged these results.  Electronically Signed   By: Rise Mu M.D.   On: 01/09/2021 23:03    Procedures Procedures   Medications Ordered in ED Medications  ketorolac (TORADOL) 30 MG/ML injection 30 mg (has no administration in time range)  prochlorperazine (COMPAZINE) injection 10 mg (has no administration in time range)  sodium chloride 0.9 % bolus 1,000 mL (has no administration in time range)  sodium chloride flush (NS) 0.9 % injection 3 mL (3 mLs Intravenous Given 01/09/21 2344)  ondansetron (ZOFRAN) injection 4 mg (4 mg Intravenous Given 01/09/21 2300)    ED Course  I have reviewed the triage vital signs and the nursing  notes.  Pertinent labs & imaging results that were available during my care of the patient were reviewed by me and considered in my medical decision making (see chart for details).    MDM Rules/Calculators/A&P                           The patient is not making good eye contact, she is hypertensive, she does have some neurologic findings however this may be effort driven, neurology was consulted and she went to the CT scanner as a code stroke.  Labs ordered, MRI ordered, at change of shift this will need to be signed out to oncoming team to follow-up results and disposition accordingly.  I appreciate neurology and their willingness to help assist with treatment and intervention plans.  Patient seen by neurology, recommendations are for treatment for likely complicated migraine given negative MRI.  Migraine cocktail ordered, and change of shift care signed out to Dr. Wilkie Aye to follow-up patient's improvement and disposition accordingly.  Patient still pending MRV of the brain  Final Clinical Impression(s) / ED Diagnoses Final diagnoses:  Right arm weakness  Complicated migraine    Rx / DC Orders ED Discharge Orders     None        Eber Hong, MD 01/09/21 2347

## 2021-01-10 DIAGNOSIS — G43109 Migraine with aura, not intractable, without status migrainosus: Secondary | ICD-10-CM

## 2021-01-10 DIAGNOSIS — R29898 Other symptoms and signs involving the musculoskeletal system: Secondary | ICD-10-CM | POA: Diagnosis not present

## 2021-01-10 DIAGNOSIS — M6281 Muscle weakness (generalized): Secondary | ICD-10-CM | POA: Diagnosis not present

## 2021-01-10 NOTE — ED Notes (Signed)
Pt ambulated without difficulty noted.

## 2021-01-10 NOTE — ED Provider Notes (Signed)
Patient signed out pending migraine cocktail and imaging.  Initially seen as a code stroke.  She was evaluated by neurology who felt her symptoms were most consistent with likely a complex or complicated migraine.  She did CT scan that was negative.  MRA and MRV were obtained.  These were reviewed by neurology and normal.  No evidence of stroke or venous dural thrombosis.  See clinical course above.  Patient clinically improved to baseline.   Physical Exam  BP (!) 150/72   Pulse 89   Temp 97.6 F (36.4 C) (Oral)   Resp (!) 23   Ht 1.626 m (5\' 4" )   Wt 104.3 kg   SpO2 100%   BMI 39.48 kg/m   Physical Exam Awake, alert, no acute distress Nontoxic-appearing Ambulatory ED Course/Procedures   Clinical Course as of 01/10/21 0225  Sat Jan 10, 2021  0222 On recheck, patient states she feels much improved.  CT and MRI imaging reviewed by neurology and reassuring.  Clinical picture felt to be consistent with complex migraine.  Patient was ambulatory and no residual focal symptoms.  Will give neurology follow-up at discharge. [CH]    Clinical Course User Index [CH] Kemond Amorin, Jan 12, 2021, MD    Procedures  MDM   Problem List Items Addressed This Visit   None Visit Diagnoses     Right arm weakness    -  Primary   Complicated migraine       Relevant Medications   ketorolac (TORADOL) 30 MG/ML injection 30 mg (Completed)   atorvastatin (LIPITOR) 40 MG tablet   naproxen (NAPROSYN) 500 MG tablet   triamterene-hydrochlorothiazide (MAXZIDE) 75-50 MG tablet             Mayer Masker, MD 01/10/21 [phone]

## 2021-01-10 NOTE — Discharge Instructions (Signed)
You were seen today for a strokelike episode.  Your imaging is reassuring and does not show any evidence of stroke.  The neurologist felt this was consistent with a complex migraine.  Follow-up with neurology as an outpatient.

## 2021-01-10 NOTE — Consult Note (Signed)
NEUROLOGY CONSULTATION NOTE   Date of service: January 10, 2021 Patient Name: Alicia Rush MRN:  [id number] DOB:  Jul 06, 1950 Reason for consult: "RUE weakness and headache" Requesting Provider: Noemi Chapel, MD. _ _ _   _ __   _ __ _ _  __ __   _ __   __ _  History of Present Illness  Alicia Rush is a 70 y.o. female with PMH significant for headache who presents with headache and RUE weakness.  Reprots that her symptom started while she was at church. She felt both of her hands were heavy and this is around 1630. She was still able to move her arms. Reports that about an hour ago, she started having headache, pressure like on the right with nausea, vomiting and sensitivity to light. Feels typical of her migraine but has never had any weakness in the past associated with her headache. She reports that the R arm weakness started while she was driving to the hospital, R face feels funny but no obvious facial droop.  A stroke code was activated in the ED with a last known well of 2140.   mRS: 0 tNKase/thrombectomy: not offered, MRI brain is negative for an acute stroke. NIHSS components Score: Comment  1a Level of Conscious 0[x]  1[]  2[]  3[]      1b LOC Questions 0[x]  1[]  2[]       1c LOC Commands 0[x]  1[]  2[]       2 Best Gaze 0[x]  1[]  2[]       3 Visual 0[x]  1[]  2[]  3[]      4 Facial Palsy 0[x]  1[]  2[]  3[]      5a Motor Arm - left 0[x]  1[]  2[]  3[]  4[]  UN[]    5b Motor Arm - Right 0[]  1[]  2[]  3[x]  4[]  UN[]    6a Motor Leg - Left 0[x]  1[]  2[]  3[]  4[]  UN[]    6b Motor Leg - Right 0[x]  1[]  2[]  3[]  4[]  UN[]    7 Limb Ataxia 0[x]  1[]  2[]  3[]  UN[]     8 Sensory 0[x]  1[]  2[]  UN[]      9 Best Language 0[x]  1[]  2[]  3[]      10 Dysarthria 0[x]  1[]  2[]  UN[]      11 Extinct. and Inattention 0[x]  1[]  2[]       TOTAL: 3      ROS   Constitutional Denies weight loss, fever and chills.   HEENT Denies changes in vision and hearing.   Respiratory Denies SOB and cough.   CV Denies palpitations and  CP   GI Denies abdominal pain, nausea, vomiting and diarrhea.   GU Denies dysuria and urinary frequency.   MSK Denies myalgia and joint pain.   Skin Denies rash and pruritus.   Neurological Denies headache and syncope.   Psychiatric Denies recent changes in mood. Denies anxiety and depression.    Past History  No past medical history on file. Past Surgical History:  Procedure Laterality Date   BREAST BIOPSY Bilateral    Family History  Problem Relation Age of Onset   Breast cancer Maternal Aunt    Social History   Socioeconomic History   Marital status: Married    Spouse name: Not on file   Number of children: Not on file   Years of education: Not on file   Highest education level: Not on file  Occupational History   Not on file  Tobacco Use   Smoking status: Not on file   Smokeless tobacco: Not on file  Substance and Sexual Activity  Alcohol use: Not on file   Drug use: Not on file   Sexual activity: Not on file  Other Topics Concern   Not on file  Social History Narrative   Not on file   Social Determinants of Health   Financial Resource Strain: Not on file  Food Insecurity: Not on file  Transportation Needs: Not on file  Physical Activity: Not on file  Stress: Not on file  Social Connections: Not on file   Allergies  Allergen Reactions   Aspirin     Other reaction(s): GI upset  ONLY IN HIGH DOSES   Meloxicam    Pseudoephedrine Hcl     Other reaction(s): facial twitches  ALLERGIC TO SUDAFED D ONLY   Sulfa Antibiotics Rash    ONLY IN HIGH DOSES    Medications  (Not in a hospital admission)    Vitals   Vitals:   01/09/21 2226 01/09/21 2346 01/09/21 2348 01/10/21 0015  BP: (!) 186/65  (!) 171/71 (!) 171/70  Pulse: 90  76 90  Resp: 18  17 (!) 31  Temp: (!) 97.4 F (36.3 C)  97.6 F (36.4 C)   TempSrc: Oral  Oral   SpO2: 97%  98% 95%  Weight:  103.3 kg 104.3 kg   Height:   5\' 4"  (1.626 m)      Body mass index is 39.48 kg/m.  Physical  Exam   General: Laying comfortably in bed; in no acute distress.  HENT: Normal oropharynx and mucosa. Normal external appearance of ears and nose.  Neck: Supple, no pain or tenderness  CV: No JVD. No peripheral edema.  Pulmonary: Symmetric Chest rise. Normal respiratory effort.  Abdomen: Soft to touch, non-tender.  Ext: No cyanosis, edema, or deformity  Skin: No rash. Normal palpation of skin.   Musculoskeletal: Normal digits and nails by inspection. No clubbing.   Neurologic Examination  Mental status/Cognition: Alert, oriented to self, place, month and year, good attention.  Speech/language: Fluent, comprehension intact, object naming intact, repetition intact.  Cranial nerves:   CN II Pupils equal and reactive to light, no VF deficits    CN III,IV,VI EOM intact, no gaze preference or deviation, no nystagmus    CN V normal sensation in V1, V2, and V3 segments bilaterally    CN VII no asymmetry, no nasolabial fold flattening    CN VIII normal hearing to speech    CN IX & X normal palatal elevation, no uvular deviation    CN XI 5/5 head turn and 5/5 shoulder shrug bilaterally    CN XII midline tongue protrusion    Motor:  Muscle bulk: normal, tone normal. Mvmt Root Nerve  Muscle Right Left Comments  SA C5/6 Ax Deltoid 1 5   EF C5/6 Mc Biceps 1 5   EE C6/7/8 Rad Triceps 1 5   WF C6/7 Med FCR     WE C7/8 PIN ECU     F Ab C8/T1 U ADM/FDI 0 5   HF L1/2/3 Fem Illopsoas 5 5   KE L2/3/4 Fem Quad 5 5   DF L4/5 D Peron Tib Ant 5 5   PF S1/2 Tibial Grc/Sol 5 5    Reflexes:  Right Left Comments  Pectoralis      Biceps (C5/6) 2 2   Brachioradialis (C5/6) 2 2    Triceps (C6/7) 2 2    Patellar (L3/4) 2 2    Achilles (S1)      Hoffman      Plantar  Jaw jerk    Sensation:  Light touch Intact throughout   Pin prick    Temperature    Vibration   Proprioception    Coordination/Complex Motor:  - Finger to Nose intact on the left. - Heel to shin intact BL - Rapid  alternating movement are intact. - Gait: deferred.  Labs   CBC:  Recent Labs  Lab 01/09/21 2235 01/09/21 2243  WBC 7.7  --   NEUTROABS 4.2  --   HGB 14.7 15.0  HCT 42.8 44.0  MCV 96.0  --   PLT 187  --     Basic Metabolic Panel:  Lab Results  Component Value Date   NA 142 01/09/2021   K 3.2 (L) 01/09/2021   CO2 27 01/09/2021   GLUCOSE 105 (H) 01/09/2021   BUN 21 01/09/2021   CREATININE 1.00 01/09/2021   CALCIUM 9.0 01/09/2021   GFRNONAA >60 01/09/2021   Lipid Panel: No results found for: LDLCALC HgbA1c: No results found for: HGBA1C Urine Drug Screen: No results found for: LABOPIA, COCAINSCRNUR, LABBENZ, AMPHETMU, THCU, LABBARB  Alcohol Level No results found for: [place]  CT Head without contrast(personally reviewed): CTH was negative for a large hypodensity concerning for a large territory infarct or hyperdensity concerning for an ICH  MRI Brain(personally reviwed): No acute intracranial abnormalities  MR Venogram Head(personally reviewed): No dural venous thrombosis  Impression   Alicia Rush is a 70 y.o. female with no significant PMH who presents with headache and RUE weakness. 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 MRI Brain is negative for an acute stroke and thus not offered tNKAse or thrombectomy. No dural venous thrombosis on MR Venogram.  Recommendations  - Headache cocktail with Toradol, Compazine, Benadryl and IV fluids. - Follow up with neurology outpatient with headache clinic. We will signoff. No further inpatient neurological workup needed at this time. ______________________________________________________________________  Discussed with Dr. Dina Rich on secure chat.  Thank you for the opportunity to take part in the care of this patient. If you have any further questions, please contact the neurology consultation attending.  Signed,  [place] Pager Number [phone] _ _ _   _ __   _ __ _ _  __ __   _ __    __ _

## 2021-01-10 NOTE — ED Notes (Signed)
Pt discharged and wheeled out of the ED in a wheel chair without difficulty. 

## 2021-01-12 ENCOUNTER — Encounter: Payer: Self-pay | Admitting: Neurology

## 2021-01-30 DIAGNOSIS — Z0000 Encounter for general adult medical examination without abnormal findings: Secondary | ICD-10-CM | POA: Diagnosis not present

## 2021-01-30 DIAGNOSIS — I10 Essential (primary) hypertension: Secondary | ICD-10-CM | POA: Diagnosis not present

## 2021-01-30 DIAGNOSIS — E7800 Pure hypercholesterolemia, unspecified: Secondary | ICD-10-CM | POA: Diagnosis not present

## 2021-01-30 DIAGNOSIS — Z23 Encounter for immunization: Secondary | ICD-10-CM | POA: Diagnosis not present

## 2021-01-30 DIAGNOSIS — E039 Hypothyroidism, unspecified: Secondary | ICD-10-CM | POA: Diagnosis not present

## 2021-02-18 DIAGNOSIS — Z23 Encounter for immunization: Secondary | ICD-10-CM | POA: Diagnosis not present

## 2021-04-10 NOTE — Progress Notes (Signed)
NEUROLOGY CONSULTATION NOTE  Alicia Rush MRN: [id number] DOB: May 07, 1950  Referring provider: Johny Blamer, MD Primary care provider: Johny Blamer, MD  Reason for consult:  migraines  Assessment/Plan:   Hemiplegic migraine.  Patient has HLD and HTN but brain on MRI shows no evidence of small vessel disease.  Also, bilateral symptoms makes a cerebrovascular event less likely.  She also has remote history of migraines. Hypertension - follow up with PCP.  No recurrence.  Follow up as needed.   Subjective:  Alicia Rush is a 71 year old female who presents for migraines.  History supplemented by ED note.  On 01/09/2021, she was at church when she felt that both arms felt heavy.  She later developed left arm numbness and tingling and afterwards right arm numbness, weakness and possible right sided facial droop (although she has remote history of right sided Bell's palsy).  She then developed a severe bitemporal pounding headache with photophobia.  In the ED, MRI and MRV of brain personally reviewed were normal and negative for acute intracranial abnormality or dural sinus thrombosis.  She was diagnosed with complicated migraine and treated with migraine cocktail.  For the next couple of days, her right arm remained weak and then improved.  Scalp felt tingling and sore for a couple of days as well.  She had diffuse body aches for the next 2 weeks.  She has not had a recurrence of headache or other symptoms.  She does report history of migraines when she was in her 96s and 30s, associated with nausea and also requiring ED visits at times.      PAST MEDICAL HISTORY: No past medical history on file.  PAST SURGICAL HISTORY: Past Surgical History:  Procedure Laterality Date   BREAST BIOPSY Bilateral     MEDICATIONS: Current Outpatient Medications on File Prior to Visit  Medication Sig Dispense Refill   atorvastatin (LIPITOR) 40 MG tablet Take 40 mg by mouth at bedtime.      cetirizine (ZYRTEC) 10 MG tablet Take 10 mg by mouth daily.     cholecalciferol (VITAMIN D3) 25 MCG (1000 UNIT) tablet Take 1,000 Units by mouth every evening.     Coenzyme Q10 (CO Q 10) 100 MG CAPS Take 100 mg by mouth every evening.     levothyroxine (SYNTHROID) 25 MCG tablet Take 25 mcg by mouth daily before breakfast.     naproxen (NAPROSYN) 500 MG tablet Take 500 mg by mouth 2 (two) times daily as needed for mild pain.     triamterene-hydrochlorothiazide (MAXZIDE) 75-50 MG tablet Take 1 tablet by mouth daily.     No current facility-administered medications on file prior to visit.    ALLERGIES: Allergies  Allergen Reactions   Aspirin     Other reaction(s): GI upset  ONLY IN HIGH DOSES   Meloxicam    Pseudoephedrine Hcl     Other reaction(s): facial twitches  ALLERGIC TO SUDAFED D ONLY   Sulfa Antibiotics Rash    ONLY IN HIGH DOSES    FAMILY HISTORY: Family History  Problem Relation Age of Onset   Breast cancer Maternal Aunt     Objective:  Blood pressure (!) 189/76, pulse 79, height 5\' 8"  (1.727 m), weight 234 lb 9.6 oz (106.4 kg), SpO2 99 %. General: No acute distress.  Patient appears well-groomed.   Head:  Normocephalic/atraumatic Eyes:  fundi examined but not visualized Neck: supple, no paraspinal tenderness, full range of motion Back: No paraspinal tenderness Heart: regular rate  and rhythm Lungs: Clear to auscultation bilaterally. Vascular: No carotid bruits. Neurological Exam: Mental status: alert and oriented to person, place, and time, recent and remote memory intact, fund of knowledge intact, attention and concentration intact, speech fluent and not dysarthric, language intact. Cranial nerves: CN I: not tested CN II: pupils equal, round and reactive to light, visual fields intact CN III, IV, VI:  full range of motion, no nystagmus, no ptosis CN V: facial sensation intact. CN VII: upper and lower face symmetric CN VIII: hearing intact CN IX, X: gag  intact, uvula midline CN XI: sternocleidomastoid and trapezius muscles intact CN XII: tongue midline Bulk & Tone: normal, no fasciculations. Motor:  muscle strength 5/5 throughout Sensation:  Pinprick, temperature and vibratory sensation intact. Deep Tendon Reflexes:  3+ upper extremities, 2+ lower extremities,  Hoffman absent, Babinski absent. Finger to nose testing:  Without dysmetria.   Heel to shin:  Without dysmetria.   Gait:  Normal station and stride.  Romberg negative.    Thank you for allowing me to take part in the care of this patient.  Shon Millet, DO  CC: Johny Blamer, MD

## 2021-04-13 ENCOUNTER — Ambulatory Visit (INDEPENDENT_AMBULATORY_CARE_PROVIDER_SITE_OTHER): Payer: Medicare Other | Admitting: Neurology

## 2021-04-13 ENCOUNTER — Other Ambulatory Visit: Payer: Self-pay

## 2021-04-13 ENCOUNTER — Encounter: Payer: Self-pay | Admitting: Neurology

## 2021-04-13 VITALS — BP 176/79 | HR 79 | Ht 68.0 in | Wt 234.6 lb

## 2021-04-13 DIAGNOSIS — G43409 Hemiplegic migraine, not intractable, without status migrainosus: Secondary | ICD-10-CM

## 2021-04-13 NOTE — Patient Instructions (Signed)
I agree that you had a migraine called a HEMIPLEGIC MIGRAINE No further workup.

## 2021-09-21 DIAGNOSIS — E039 Hypothyroidism, unspecified: Secondary | ICD-10-CM | POA: Diagnosis not present

## 2021-09-21 DIAGNOSIS — R600 Localized edema: Secondary | ICD-10-CM | POA: Diagnosis not present

## 2021-09-21 DIAGNOSIS — E7800 Pure hypercholesterolemia, unspecified: Secondary | ICD-10-CM | POA: Diagnosis not present

## 2021-09-21 DIAGNOSIS — J309 Allergic rhinitis, unspecified: Secondary | ICD-10-CM | POA: Diagnosis not present

## 2021-09-21 DIAGNOSIS — R030 Elevated blood-pressure reading, without diagnosis of hypertension: Secondary | ICD-10-CM | POA: Diagnosis not present

## 2021-09-21 DIAGNOSIS — L680 Hirsutism: Secondary | ICD-10-CM | POA: Diagnosis not present

## 2021-10-16 ENCOUNTER — Other Ambulatory Visit: Payer: Self-pay | Admitting: Family Medicine

## 2021-10-16 DIAGNOSIS — Z1231 Encounter for screening mammogram for malignant neoplasm of breast: Secondary | ICD-10-CM

## 2021-11-26 ENCOUNTER — Ambulatory Visit
Admission: RE | Admit: 2021-11-26 | Discharge: 2021-11-26 | Disposition: A | Discharge status: Home / Self Care | Payer: Medicare Other | Source: Ambulatory Visit | Attending: Family Medicine | Admitting: Family Medicine

## 2021-11-26 DIAGNOSIS — Z1231 Encounter for screening mammogram for malignant neoplasm of breast: Secondary | ICD-10-CM

## 2022-02-01 DIAGNOSIS — E7800 Pure hypercholesterolemia, unspecified: Secondary | ICD-10-CM | POA: Diagnosis not present

## 2022-02-01 DIAGNOSIS — E039 Hypothyroidism, unspecified: Secondary | ICD-10-CM | POA: Diagnosis not present

## 2022-02-05 DIAGNOSIS — L680 Hirsutism: Secondary | ICD-10-CM | POA: Diagnosis not present

## 2022-02-05 DIAGNOSIS — R600 Localized edema: Secondary | ICD-10-CM | POA: Diagnosis not present

## 2022-02-05 DIAGNOSIS — E7800 Pure hypercholesterolemia, unspecified: Secondary | ICD-10-CM | POA: Diagnosis not present

## 2022-02-05 DIAGNOSIS — Z0000 Encounter for general adult medical examination without abnormal findings: Secondary | ICD-10-CM | POA: Diagnosis not present

## 2022-02-05 DIAGNOSIS — J309 Allergic rhinitis, unspecified: Secondary | ICD-10-CM | POA: Diagnosis not present

## 2022-02-05 DIAGNOSIS — Z23 Encounter for immunization: Secondary | ICD-10-CM | POA: Diagnosis not present

## 2022-02-05 DIAGNOSIS — E039 Hypothyroidism, unspecified: Secondary | ICD-10-CM | POA: Diagnosis not present

## 2022-02-05 DIAGNOSIS — R030 Elevated blood-pressure reading, without diagnosis of hypertension: Secondary | ICD-10-CM | POA: Diagnosis not present

## 2022-03-05 DIAGNOSIS — Z23 Encounter for immunization: Secondary | ICD-10-CM | POA: Diagnosis not present

## 2022-08-09 DIAGNOSIS — E039 Hypothyroidism, unspecified: Secondary | ICD-10-CM | POA: Diagnosis not present

## 2022-08-09 DIAGNOSIS — K130 Diseases of lips: Secondary | ICD-10-CM | POA: Diagnosis not present

## 2022-08-09 DIAGNOSIS — E7800 Pure hypercholesterolemia, unspecified: Secondary | ICD-10-CM | POA: Diagnosis not present

## 2022-08-09 DIAGNOSIS — R030 Elevated blood-pressure reading, without diagnosis of hypertension: Secondary | ICD-10-CM | POA: Diagnosis not present

## 2022-08-09 DIAGNOSIS — R600 Localized edema: Secondary | ICD-10-CM | POA: Diagnosis not present

## 2022-09-13 DIAGNOSIS — M13842 Other specified arthritis, left hand: Secondary | ICD-10-CM | POA: Diagnosis not present

## 2022-10-18 ENCOUNTER — Other Ambulatory Visit: Payer: Self-pay | Admitting: Family Medicine

## 2022-10-18 DIAGNOSIS — Z1231 Encounter for screening mammogram for malignant neoplasm of breast: Secondary | ICD-10-CM

## 2022-11-25 DIAGNOSIS — Z800 Family history of malignant neoplasm of digestive organs: Secondary | ICD-10-CM | POA: Diagnosis not present

## 2022-11-25 DIAGNOSIS — K644 Residual hemorrhoidal skin tags: Secondary | ICD-10-CM | POA: Diagnosis not present

## 2022-11-25 DIAGNOSIS — K648 Other hemorrhoids: Secondary | ICD-10-CM | POA: Diagnosis not present

## 2022-11-25 DIAGNOSIS — K5730 Diverticulosis of large intestine without perforation or abscess without bleeding: Secondary | ICD-10-CM | POA: Diagnosis not present

## 2022-11-25 DIAGNOSIS — Z1211 Encounter for screening for malignant neoplasm of colon: Secondary | ICD-10-CM | POA: Diagnosis not present

## 2022-11-25 DIAGNOSIS — D479 Neoplasm of uncertain behavior of lymphoid, hematopoietic and related tissue, unspecified: Secondary | ICD-10-CM | POA: Diagnosis not present

## 2022-11-29 ENCOUNTER — Ambulatory Visit: Payer: Medicare Other

## 2022-12-08 DIAGNOSIS — D479 Neoplasm of uncertain behavior of lymphoid, hematopoietic and related tissue, unspecified: Secondary | ICD-10-CM | POA: Diagnosis not present

## 2022-12-21 DIAGNOSIS — K635 Polyp of colon: Secondary | ICD-10-CM | POA: Diagnosis not present

## 2023-01-03 ENCOUNTER — Ambulatory Visit
Admission: RE | Admit: 2023-01-03 | Discharge: 2023-01-03 | Disposition: A | Discharge status: Home / Self Care | Payer: Medicare Other | Source: Ambulatory Visit | Attending: Family Medicine | Admitting: Family Medicine

## 2023-01-03 DIAGNOSIS — Z1231 Encounter for screening mammogram for malignant neoplasm of breast: Secondary | ICD-10-CM | POA: Diagnosis not present

## 2023-01-14 DIAGNOSIS — D479 Neoplasm of uncertain behavior of lymphoid, hematopoietic and related tissue, unspecified: Secondary | ICD-10-CM | POA: Diagnosis not present

## 2023-01-14 DIAGNOSIS — K6389 Other specified diseases of intestine: Secondary | ICD-10-CM | POA: Diagnosis not present

## 2023-01-14 DIAGNOSIS — K648 Other hemorrhoids: Secondary | ICD-10-CM | POA: Diagnosis not present

## 2023-01-14 DIAGNOSIS — K5730 Diverticulosis of large intestine without perforation or abscess without bleeding: Secondary | ICD-10-CM | POA: Diagnosis not present

## 2023-01-14 DIAGNOSIS — K644 Residual hemorrhoidal skin tags: Secondary | ICD-10-CM | POA: Diagnosis not present

## 2023-01-18 DIAGNOSIS — D479 Neoplasm of uncertain behavior of lymphoid, hematopoietic and related tissue, unspecified: Secondary | ICD-10-CM | POA: Diagnosis not present

## 2023-02-10 DIAGNOSIS — H3581 Retinal edema: Secondary | ICD-10-CM | POA: Diagnosis not present

## 2023-02-10 DIAGNOSIS — I10 Essential (primary) hypertension: Secondary | ICD-10-CM | POA: Diagnosis not present

## 2023-02-10 DIAGNOSIS — Z23 Encounter for immunization: Secondary | ICD-10-CM | POA: Diagnosis not present

## 2023-02-10 DIAGNOSIS — Z0000 Encounter for general adult medical examination without abnormal findings: Secondary | ICD-10-CM | POA: Diagnosis not present

## 2023-02-10 DIAGNOSIS — E7800 Pure hypercholesterolemia, unspecified: Secondary | ICD-10-CM | POA: Diagnosis not present

## 2023-02-10 DIAGNOSIS — E039 Hypothyroidism, unspecified: Secondary | ICD-10-CM | POA: Diagnosis not present

## 2023-02-10 DIAGNOSIS — R600 Localized edema: Secondary | ICD-10-CM | POA: Diagnosis not present

## 2023-03-30 DIAGNOSIS — H40013 Open angle with borderline findings, low risk, bilateral: Secondary | ICD-10-CM | POA: Diagnosis not present

## 2023-08-08 DIAGNOSIS — R600 Localized edema: Secondary | ICD-10-CM | POA: Diagnosis not present

## 2023-08-08 DIAGNOSIS — E039 Hypothyroidism, unspecified: Secondary | ICD-10-CM | POA: Diagnosis not present

## 2023-08-08 DIAGNOSIS — E7800 Pure hypercholesterolemia, unspecified: Secondary | ICD-10-CM | POA: Diagnosis not present

## 2023-08-08 DIAGNOSIS — I10 Essential (primary) hypertension: Secondary | ICD-10-CM | POA: Diagnosis not present

## 2023-10-07 DIAGNOSIS — E7800 Pure hypercholesterolemia, unspecified: Secondary | ICD-10-CM | POA: Diagnosis not present

## 2023-12-06 ENCOUNTER — Other Ambulatory Visit: Payer: Self-pay | Admitting: Family Medicine

## 2023-12-06 DIAGNOSIS — Z1231 Encounter for screening mammogram for malignant neoplasm of breast: Secondary | ICD-10-CM

## 2024-01-09 ENCOUNTER — Ambulatory Visit
Admission: RE | Admit: 2024-01-09 | Discharge: 2024-01-09 | Disposition: A | Discharge status: Home / Self Care | Source: Ambulatory Visit | Attending: Family Medicine | Admitting: Family Medicine

## 2024-01-09 DIAGNOSIS — Z1231 Encounter for screening mammogram for malignant neoplasm of breast: Secondary | ICD-10-CM | POA: Diagnosis not present

## 2024-02-14 DIAGNOSIS — I10 Essential (primary) hypertension: Secondary | ICD-10-CM | POA: Diagnosis not present

## 2024-02-14 DIAGNOSIS — Z23 Encounter for immunization: Secondary | ICD-10-CM | POA: Diagnosis not present

## 2024-02-14 DIAGNOSIS — R600 Localized edema: Secondary | ICD-10-CM | POA: Diagnosis not present

## 2024-02-14 DIAGNOSIS — E7800 Pure hypercholesterolemia, unspecified: Secondary | ICD-10-CM | POA: Diagnosis not present

## 2024-02-14 DIAGNOSIS — E039 Hypothyroidism, unspecified: Secondary | ICD-10-CM | POA: Diagnosis not present
